# Patient Record
Sex: Male | Born: 1990 | Race: White | Hispanic: No | Smoking: Current some day smoker
Health system: Southern US, Community
[De-identification: ages and names within clinical notes are randomized; demographics above are authoritative.]

## PROBLEM LIST (undated history)

## (undated) DIAGNOSIS — B1920 Unspecified viral hepatitis C without hepatic coma: Secondary | ICD-10-CM

---

## 2019-08-02 ENCOUNTER — Emergency Department (HOSPITAL_COMMUNITY)
Admission: EM | Admit: 2019-08-02 | Discharge: 2019-08-02 | Disposition: A | Discharge status: Home / Self Care | Payer: Self-pay | Attending: Emergency Medicine | Admitting: Emergency Medicine

## 2019-08-02 ENCOUNTER — Encounter (HOSPITAL_COMMUNITY): Payer: Self-pay | Admitting: Emergency Medicine

## 2019-08-02 ENCOUNTER — Emergency Department (HOSPITAL_COMMUNITY): Payer: Self-pay

## 2019-08-02 ENCOUNTER — Other Ambulatory Visit: Payer: Self-pay

## 2019-08-02 DIAGNOSIS — F17210 Nicotine dependence, cigarettes, uncomplicated: Secondary | ICD-10-CM | POA: Insufficient documentation

## 2019-08-02 DIAGNOSIS — X58XXXA Exposure to other specified factors, initial encounter: Secondary | ICD-10-CM | POA: Insufficient documentation

## 2019-08-02 DIAGNOSIS — B182 Chronic viral hepatitis C: Secondary | ICD-10-CM | POA: Insufficient documentation

## 2019-08-02 DIAGNOSIS — S0003XA Contusion of scalp, initial encounter: Secondary | ICD-10-CM | POA: Insufficient documentation

## 2019-08-02 DIAGNOSIS — Y929 Unspecified place or not applicable: Secondary | ICD-10-CM | POA: Insufficient documentation

## 2019-08-02 DIAGNOSIS — S0181XA Laceration without foreign body of other part of head, initial encounter: Secondary | ICD-10-CM | POA: Insufficient documentation

## 2019-08-02 DIAGNOSIS — R9431 Abnormal electrocardiogram [ECG] [EKG]: Secondary | ICD-10-CM | POA: Insufficient documentation

## 2019-08-02 DIAGNOSIS — M79671 Pain in right foot: Secondary | ICD-10-CM | POA: Insufficient documentation

## 2019-08-02 DIAGNOSIS — Y999 Unspecified external cause status: Secondary | ICD-10-CM | POA: Insufficient documentation

## 2019-08-02 DIAGNOSIS — G8929 Other chronic pain: Secondary | ICD-10-CM | POA: Insufficient documentation

## 2019-08-02 DIAGNOSIS — Y939 Activity, unspecified: Secondary | ICD-10-CM | POA: Insufficient documentation

## 2019-08-02 DIAGNOSIS — Z23 Encounter for immunization: Secondary | ICD-10-CM | POA: Insufficient documentation

## 2019-08-02 HISTORY — DX: Unspecified viral hepatitis C without hepatic coma: B19.20

## 2019-08-02 LAB — BASIC METABOLIC PANEL
Anion gap: 8 (ref 5–15)
BUN: 15 mg/dL (ref 6–20)
CO2: 29 mmol/L (ref 22–32)
Calcium: 9.2 mg/dL (ref 8.9–10.3)
Chloride: 102 mmol/L (ref 98–111)
Creatinine, Ser: 1.12 mg/dL (ref 0.61–1.24)
GFR calc Af Amer: >60 mL/min (ref >60)
GFR calc non Af Amer: >60 mL/min (ref >60)
Glucose, Bld: 103 mg/dL — ABNORMAL HIGH (ref 70–99)
Potassium: 4.3 mmol/L (ref 3.5–5.1)
Sodium: 139 mmol/L (ref 135–145)

## 2019-08-02 LAB — CBC WITH DIFFERENTIAL/PLATELET
Abs Immature Granulocytes: 0.04 10*3/uL (ref 0.00–0.07)
Basophils Absolute: 0 10*3/uL (ref 0.0–0.1)
Basophils Relative: 0 %
Eosinophils Absolute: 0 10*3/uL (ref 0.0–0.5)
Eosinophils Relative: 0 %
HCT: 42.6 % (ref 39.0–52.0)
Hemoglobin: 14.6 g/dL (ref 13.0–17.0)
Immature Granulocytes: 0 %
Lymphocytes Relative: 18 %
Lymphs Abs: 2.6 10*3/uL (ref 0.7–4.0)
MCH: 30.5 pg (ref 26.0–34.0)
MCHC: 34.3 g/dL (ref 30.0–36.0)
MCV: 89.1 fL (ref 80.0–100.0)
Monocytes Absolute: 1.8 10*3/uL — ABNORMAL HIGH (ref 0.1–1.0)
Monocytes Relative: 12 %
Neutro Abs: 9.9 10*3/uL — ABNORMAL HIGH (ref 1.7–7.7)
Neutrophils Relative %: 70 %
Platelets: 312 10*3/uL (ref 150–400)
RBC: 4.78 MIL/uL (ref 4.22–5.81)
RDW: 12.4 % (ref 11.5–15.5)
WBC: 14.3 10*3/uL — ABNORMAL HIGH (ref 4.0–10.5)
nRBC: 0 % (ref 0.0–0.2)

## 2019-08-02 MED ORDER — DOXYCYCLINE HYCLATE 100 MG PO CAPS: 100.0000 mg | ORAL_CAPSULE | Freq: Two times a day (BID) | ORAL | 0 refills | Status: AC

## 2019-08-02 MED ORDER — LIDOCAINE-EPINEPHRINE (PF) 2 %-1:200000 IJ SOLN
20.0000 mL | Freq: Once | INTRAMUSCULAR | Status: AC
  Administered 2019-08-02: 20 mL
  Filled 2019-08-02: qty 20

## 2019-08-02 MED ORDER — DOXYCYCLINE HYCLATE 100 MG PO CAPS: 100.0000 mg | ORAL_CAPSULE | Freq: Two times a day (BID) | ORAL | 0 refills | Status: DC

## 2019-08-02 MED ORDER — TETANUS-DIPHTH-ACELL PERTUSSIS 5-2.5-18.5 LF-MCG/0.5 IM SUSP
0.5000 mL | Freq: Once | INTRAMUSCULAR | Status: AC
  Administered 2019-08-02: 0.5 mL via INTRAMUSCULAR
  Filled 2019-08-02: qty 0.5

## 2019-08-02 NOTE — Discharge Instructions (Addendum)
Your medications will be covered by the hospital if you go to the pharmacy below to pick them up.  Case management has arranged this for you.  All of your imaging today was unremarkable.  Take doxycycline as prescribed for 5 days to help prevent infection of your wound. Keep your wound dry and dressing applied until this time tomorrow. After 24 hours, you may wash with warm soapy water. Dry and clean dressing. Do this daily until your sutures are removed.  Follow-up: Please follow-up with the cardiology office for echocardiogram for further evaluation and treatment of your abnormal EKG findings today.  If your toe continues to give you problems, p you can follow-up with the podiatrist, Dr. Amalia Hailey, for further evaluation and treatment.  Lease follow-up with your primary care provider or return to emergency department in 7-10 days for suture removal if sutures are not falling out with a gentle tug. Be aware of signs of infection: fever, increasing pain, redness, swelling, drainage from the area. Please call your primary care provider or return to emergency department if you develop any of these symptoms or if any of the sutures come out prior to removal. Please return to the emergency department if you develop any other new or worsening symptoms.
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
## 2019-08-02 NOTE — ED Notes (Signed)
Patient transported to X-ray 

## 2019-08-02 NOTE — Care Management (Signed)
ED CM received CM consult concerning medication needs.  Patient was just recently discharged from jail and does not have any money but needs antibiotics. Patient is eligible for MATCH patient was enrolled, and co-pay waived.  EDP is sending prescriptions to 24 hour [facility] on [address], MATCH letter was sent there as well via [place] fax, CM called to confirm receipt. No further ED CM needs identified.
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
## 2019-08-02 NOTE — ED Notes (Signed)
Pt dc'd home w/all belongings, a/o x4,  

## 2019-08-02 NOTE — ED Notes (Signed)
Pt ambulated independently to and from bathroom 

## 2019-08-02 NOTE — Care Management (Addendum)
   [place] Medication Assistance Card Name: Carless Slatten (MRN): [id number] [facility]: [id number] RX Group: [id number] Discharge Date: 08/02/2019 Expiration Date:08/13/2019                                           (must be filled within 7 days of discharge)    Dear   : Laurence Slate  You have been approved to have the prescriptions written by your discharging physician filled through our [facility] (Medication Assistance Through [facility]) program. This program allows for a one-time (no refills) 34-day supply of selected medications for a low copay amount.  The copay is $3.00 per prescription. For instance, if you have one prescription, you will pay $3.00; for two prescriptions, you pay $6.00; for three prescriptions, you pay $9.00; and so on.  Only certain pharmacies are participating in this program with [facility]. You will need to select one of the pharmacies from the attached list and take your prescriptions, this letter, and your photo ID to one of the participating pharmacies.   We are excited that you are able to use the [facility] program to get your medications. These prescriptions must be filled within 7 days of hospital discharge or they will no longer be valid for the [facility] program. Should you have any problems with your prescriptions please contact your case management team member at [phone] for [place]/ [place] you, [place] Management

## 2021-04-15 IMAGING — CR DG FOOT COMPLETE 3+V*R*
3 series · 3 of 3 positions shown · non-contrast
Comparison: None.

CLINICAL DATA: 28-year-old male status post toe fracture 4-5 months
ago. Continued bruising right great toenail.

EXAM:
RIGHT FOOT COMPLETE - 3+ VIEW

[foot ap]
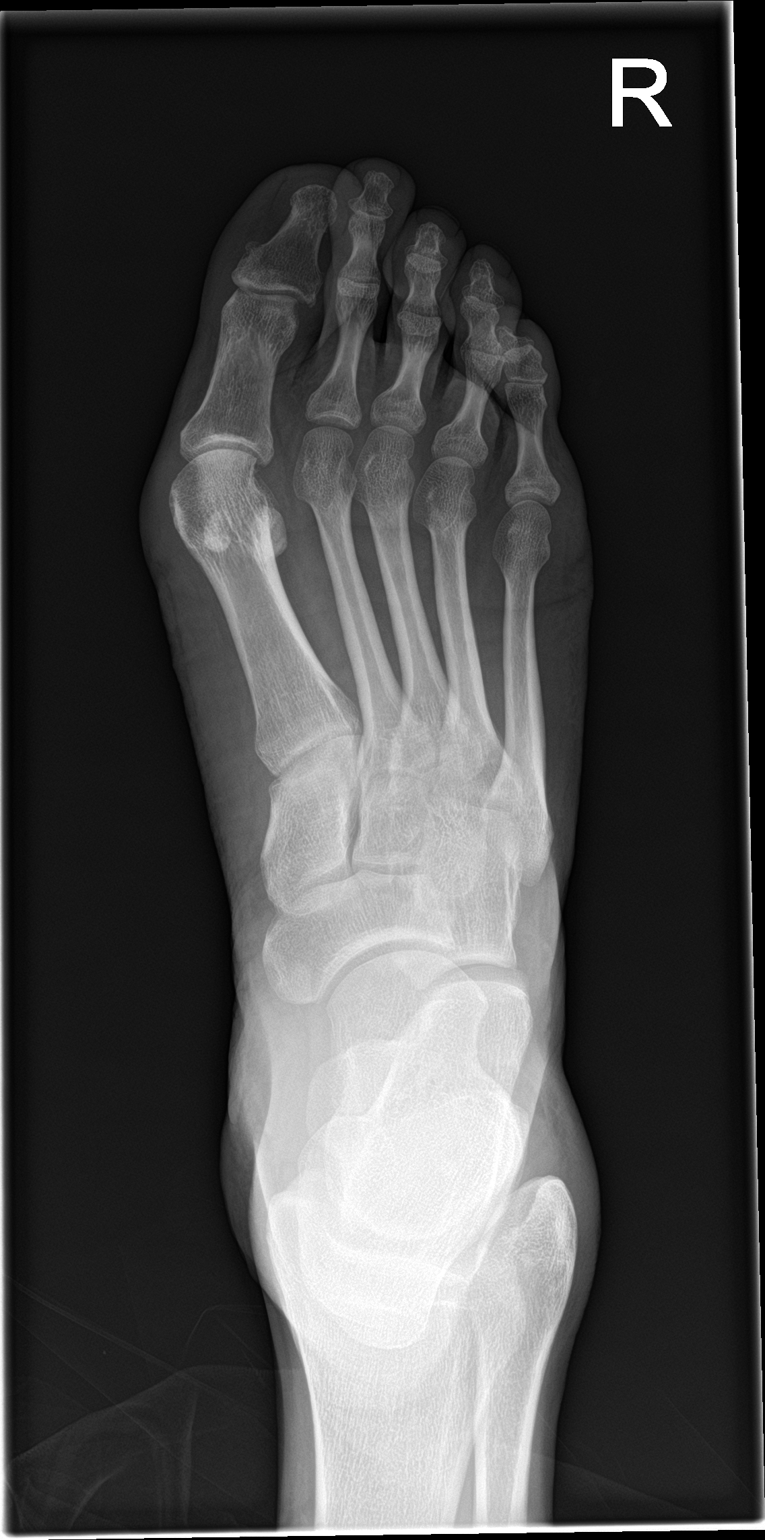

[foot obl]
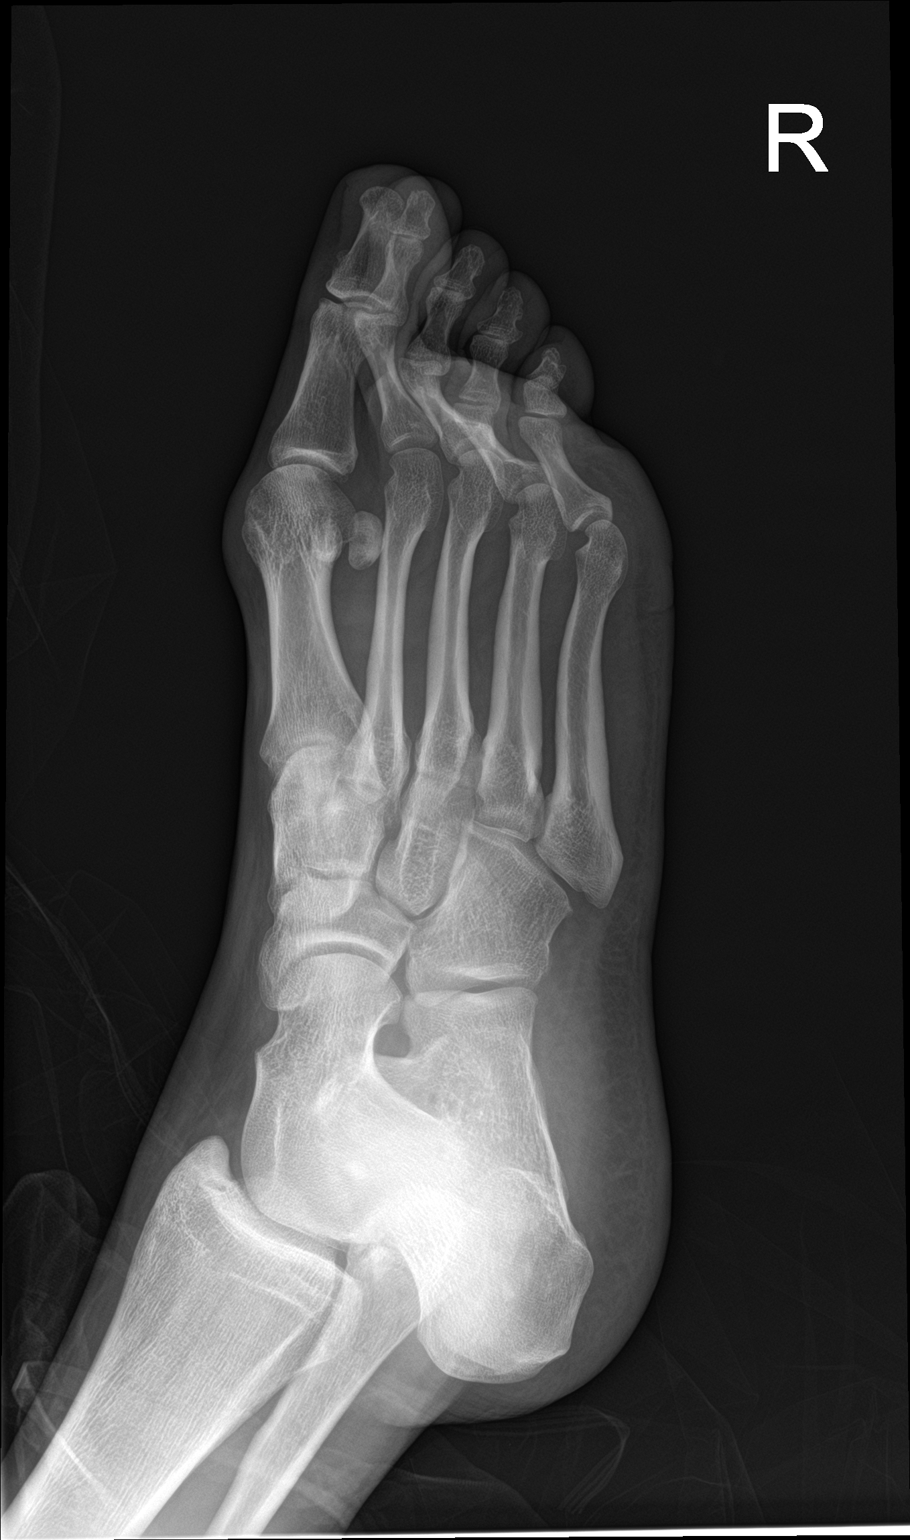

[foot lat]
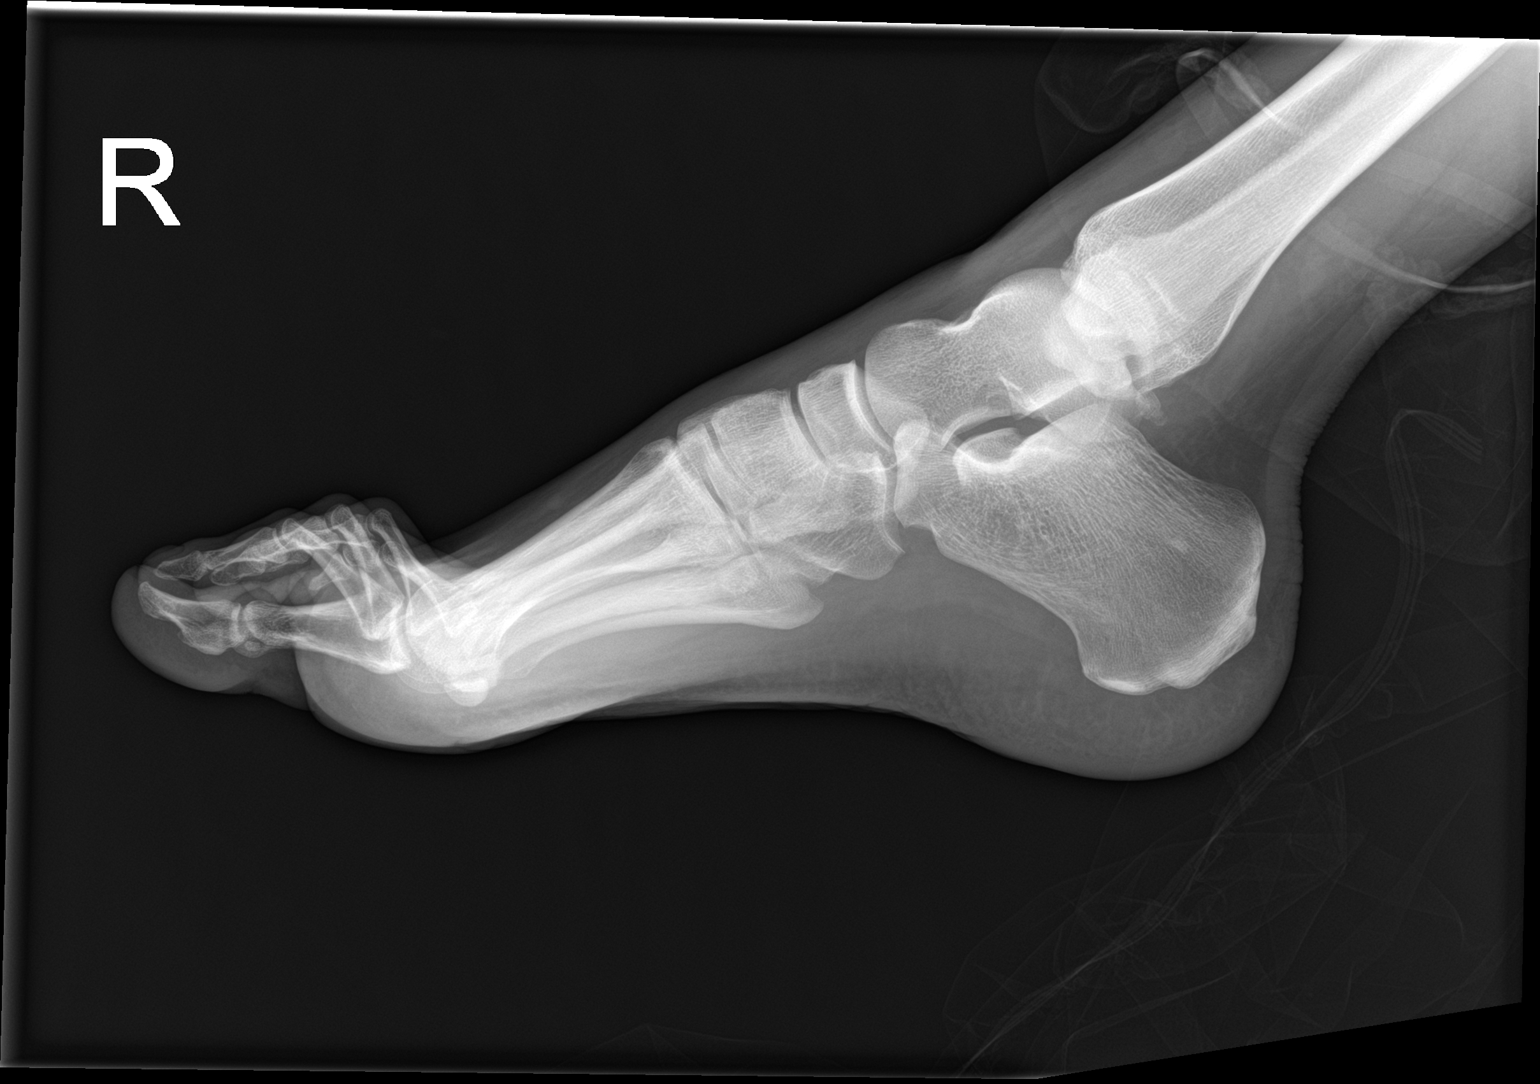

[3 of 3 positions shown; findings below may reference images not displayed]

FINDINGS: Bone mineralization is within normal limits. Calcaneus and tarsal
bones appear intact and normally aligned. Metatarsals appear intact.
No phalanx fracture identified. The metatarsal and IP joint spaces
appear preserved.
IMPRESSION: No fracture or dislocation identified about the right foot.
